# Patient Record
Sex: Male | Born: 1955 | Marital: Single | Smoking: Current some day smoker
Health system: Southern US, Community
[De-identification: ages and names within clinical notes are randomized; demographics above are authoritative.]

## PROBLEM LIST (undated history)

## (undated) DIAGNOSIS — F4310 Post-traumatic stress disorder, unspecified: Secondary | ICD-10-CM

## (undated) HISTORY — PX: STOMACH SURGERY: SHX791

## (undated) HISTORY — PX: CORONARY ANGIOPLASTY WITH STENT PLACEMENT: SHX49

## (undated) HISTORY — PX: SMALL INTESTINE SURGERY: SHX150

---

## 2011-10-17 ENCOUNTER — Observation Stay: Payer: Self-pay | Admitting: Internal Medicine

## 2011-10-17 DIAGNOSIS — R079 Chest pain, unspecified: Secondary | ICD-10-CM

## 2011-10-17 LAB — BASIC METABOLIC PANEL
BUN: 13 mg/dL (ref 7–18)
Calcium, Total: 9 mg/dL (ref 8.5–10.1)
Chloride: 109 mmol/L — ABNORMAL HIGH (ref 98–107)
Co2: 25 mmol/L (ref 21–32)
Creatinine: 0.95 mg/dL (ref 0.60–1.30)
Glucose: 96 mg/dL (ref 65–99)
Osmolality: 281 (ref 275–301)
Potassium: 3.9 mmol/L (ref 3.5–5.1)

## 2011-10-17 LAB — CBC
HCT: 44.1 % (ref 40.0–52.0)
HGB: 14.4 g/dL (ref 13.0–18.0)
MCH: 29 pg (ref 26.0–34.0)
MCHC: 32.6 g/dL (ref 32.0–36.0)
RBC: 4.94 10*6/uL (ref 4.40–5.90)
RDW: 15.7 % — ABNORMAL HIGH (ref 11.5–14.5)
WBC: 5.9 10*3/uL (ref 3.8–10.6)

## 2011-10-17 LAB — CK TOTAL AND CKMB (NOT AT ARMC): CK, Total: 105 U/L (ref 35–232)

## 2011-10-17 LAB — TROPONIN I: Troponin-I: <0.02 ng/mL
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
## 2014-09-26 NOTE — Discharge Summary (Signed)
PATIENT NAME:  Jim Burnett, Jim Burnett MR#:  [id number] DATE OF BIRTH:  Mar 20, 1956  DATE OF ADMISSION:  10/17/2011 DATE OF DISCHARGE:  10/17/2011  ADDENDUM:  His nuclear medicine scan was normal, no evidence of ischemia ____________________________ Chirstine Defrain P. Juliene PinaMody, MD spm:slb D: 10/18/2011 12:08:56 ET T: 10/18/2011 12:29:46 ET JOB#: [id number]  cc: Denny Mccree P. Juliene PinaMody, MD, <Dictator> Deaaron Fulghum P Durwin Davisson MD ELECTRONICALLY SIGNED 10/18/2011 21:08

## 2014-09-26 NOTE — Discharge Summary (Signed)
PATIENT NAME:  Jim Burnett, Jim Burnett MR#:  [id number] DATE OF BIRTH:  Jul 31, 1955  DATE OF ADMISSION:  10/17/2011 DATE OF DISCHARGE:  10/17/2011  ADMISSION DIAGNOSIS: Chest pain.   DISCHARGE DIAGNOSES:  1. Chest pain.  2. Elevated blood pressure without a diagnosis of hypertension.  3. Anxiety and depression.   CONSULTANTS: None.   LABS/STUDIES: 2-D echocardiogram showed an ejection fraction of 55%. Transmitral spectral Doppler flow pattern suggestive of impaired LV relaxation. RVSP is normal.   Troponins were negative.   STRESS TEST MYOVOEW: negative for acute coranary ischemia   HOSPITAL COURSE: This is a 59 year old male who presented with vague symptoms of chest pain. For further details, please refer to the history and physical.  1. Chest pain. The patient's symptoms subsided on admission.  He had no acute events on telemetry. Troponins were negative. He underwent a Myoview stress test which did not show evidence of cardiac ischemia. 2. Elevated blood pressure without a diagnosis of hypertension. The patient would like to follow-up with his primary care physician in the community before starting on medication. A list was provided for him.  3. Anxiety and depression. The patient will continue with his outpatient medication of bupropion.   DISCHARGE MEDICATIONS:  1. Bupropion 100 mg twice a day. 2. Aspirin 81 mg daily.  3. Flomax 0.4 mg daily.   DISCHARGE DIET: Low sodium.   DISCHARGE ACTIVITY: As tolerated.   DISCHARGE FOLLOWUP: The patient was provided a primary care list and he will choose one physician to followup with.    TIME SPENT: 35 minutes. ____________________________ Janyth ContesSital P. Juliene PinaMody, MD spm:slb D: 10/17/2011 14:17:21 ET T: 10/17/2011 17:51:33 ET JOB#: [id number]  cc: Anastasia Tompson P. Juliene PinaMody, MD, <Dictator> Renee Beale P Tavone Caesar MD ELECTRONICALLY SIGNED 10/18/2011 21:08

## 2014-12-05 ENCOUNTER — Emergency Department
Admission: EM | Admit: 2014-12-05 | Discharge: 2014-12-05 | Disposition: A | Discharge status: Home / Self Care | Payer: Self-pay | Attending: Emergency Medicine | Admitting: Emergency Medicine

## 2014-12-05 ENCOUNTER — Encounter: Payer: Self-pay | Admitting: Emergency Medicine

## 2014-12-05 ENCOUNTER — Emergency Department: Payer: Self-pay

## 2014-12-05 DIAGNOSIS — Z720 Tobacco use: Secondary | ICD-10-CM | POA: Insufficient documentation

## 2014-12-05 DIAGNOSIS — W25XXXA Contact with sharp glass, initial encounter: Secondary | ICD-10-CM | POA: Insufficient documentation

## 2014-12-05 DIAGNOSIS — S91331A Puncture wound without foreign body, right foot, initial encounter: Secondary | ICD-10-CM | POA: Insufficient documentation

## 2014-12-05 DIAGNOSIS — Y998 Other external cause status: Secondary | ICD-10-CM | POA: Insufficient documentation

## 2014-12-05 DIAGNOSIS — Y9389 Activity, other specified: Secondary | ICD-10-CM | POA: Insufficient documentation

## 2014-12-05 DIAGNOSIS — Z23 Encounter for immunization: Secondary | ICD-10-CM | POA: Insufficient documentation

## 2014-12-05 DIAGNOSIS — IMO0002 Reserved for concepts with insufficient information to code with codable children: Secondary | ICD-10-CM

## 2014-12-05 DIAGNOSIS — Y9259 Other trade areas as the place of occurrence of the external cause: Secondary | ICD-10-CM | POA: Insufficient documentation

## 2014-12-05 DIAGNOSIS — S91311A Laceration without foreign body, right foot, initial encounter: Secondary | ICD-10-CM | POA: Insufficient documentation

## 2014-12-05 HISTORY — DX: Post-traumatic stress disorder, unspecified: F43.10

## 2014-12-05 MED ORDER — IBUPROFEN 800 MG PO TABS
800.0000 mg | ORAL_TABLET | Freq: Once | ORAL | Status: AC
  Administered 2014-12-05: 800 mg via ORAL

## 2014-12-05 MED ORDER — BACITRACIN ZINC 500 UNIT/GM EX OINT
TOPICAL_OINTMENT | CUTANEOUS | Status: AC
  Administered 2014-12-05: 1 via TOPICAL
  Filled 2014-12-05: qty 0.9

## 2014-12-05 MED ORDER — BACITRACIN 500 UNIT/GM EX OINT
1.0000 "application " | TOPICAL_OINTMENT | Freq: Two times a day (BID) | CUTANEOUS | Status: DC
  Administered 2014-12-05: 1 via TOPICAL

## 2014-12-05 MED ORDER — TETANUS-DIPHTH-ACELL PERTUSSIS 5-2.5-18.5 LF-MCG/0.5 IM SUSP
INTRAMUSCULAR | Status: AC
  Administered 2014-12-05: 0.5 mL via INTRAMUSCULAR
  Filled 2014-12-05: qty 0.5

## 2014-12-05 MED ORDER — IBUPROFEN 800 MG PO TABS: 800.0000 mg | ORAL_TABLET | Freq: Three times a day (TID) | ORAL | Status: AC | PRN

## 2014-12-05 MED ORDER — TETANUS-DIPHTH-ACELL PERTUSSIS 5-2.5-18.5 LF-MCG/0.5 IM SUSP
0.5000 mL | Freq: Once | INTRAMUSCULAR | Status: AC
  Administered 2014-12-05: 0.5 mL via INTRAMUSCULAR

## 2014-12-05 MED ORDER — IBUPROFEN 800 MG PO TABS
ORAL_TABLET | ORAL | Status: AC
  Administered 2014-12-05: 800 mg via ORAL
  Filled 2014-12-05: qty 1

## 2014-12-05 MED ORDER — CEPHALEXIN 500 MG PO CAPS: 500.0000 mg | ORAL_CAPSULE | Freq: Two times a day (BID) | ORAL | Status: AC

## 2014-12-05 NOTE — ED Notes (Addendum)
Pt reports stepping on a glass piece of crack pipe at a hotel. Pt reports bleeding from right heel that has resolved at this time. Pt reports throbbing pain to right heel.

## 2014-12-05 NOTE — ED Provider Notes (Signed)
[facility] Emergency Department Provider Note ____________________________________________  Time seen: Approximately 7:00 PM  I have reviewed the triage vital signs and the nursing notes.   HISTORY  Chief Complaint Laceration   HPI Jim Burnett is a 59 y.o. male who presents to the emergency department for evaluation of right heel laceration. He states that he is staying in a [employer]. He tells me he went to get into the shower and looked down and saw that there was blood on the floor of the tub and on the towel on the floor. He then saw that he was bleeding from his right heel. After going back into the room, he noticed a "broken crack pipe" and blood in the floor.   Past Medical History  Diagnosis Date  . PTSD (post-traumatic stress disorder)     There are no active problems to display for this patient.   Past Surgical History  Procedure Laterality Date  . Coronary angioplasty with stent placement    . Stomach surgery    . Small intestine surgery      Current Outpatient Rx  Name  Route  Sig  Dispense  Refill  . cephALEXin (KEFLEX) 500 MG capsule   Oral   Take 1 capsule (500 mg total) by mouth 2 (two) times daily.   20 capsule   0   . ibuprofen (ADVIL,MOTRIN) 800 MG tablet   Oral   Take 1 tablet (800 mg total) by mouth every 8 (eight) hours as needed.   30 tablet   0     Allergies Quinine derivatives  History reviewed. No pertinent family history.  Social History History  Substance Use Topics  . Smoking status: Current Some Day Smoker    Types: Cigarettes  . Smokeless tobacco: Never Used  . Alcohol Use: Yes     Comment: occasionaly    Review of Systems Constitutional: No recent illness. Eyes: No visual changes. ENT: No sore throat. Cardiovascular: Denies chest pain or palpitations. Respiratory: Denies shortness of breath. Gastrointestinal: No abdominal pain.  Genitourinary: Negative for dysuria. Musculoskeletal: Pain in  right heel. Skin: Negative for rash. Neurological: Negative for headaches, focal weakness or numbness. 10-point ROS otherwise negative.  ____________________________________________   PHYSICAL EXAM:  VITAL SIGNS: ED Triage Vitals  Enc Vitals Group     BP 12/05/14 1829 127/70 mmHg     Pulse Rate 12/05/14 1829 76     Resp 12/05/14 1829 18     Temp 12/05/14 1829 97.7 F (36.5 C)     Temp Source 12/05/14 1829 Oral     SpO2 12/05/14 1829 98 %     Weight 12/05/14 1829 164 lb (74.39 kg)     Height 12/05/14 1829  (1.753 m)     Head Cir --      Peak Flow --      Pain Score 12/05/14 1838 7     Pain Loc --      Pain Edu? --      Excl. in GC? --     Constitutional: Alert and oriented. Well appearing and in no acute distress. Eyes: Conjunctivae are normal. EOMI. Head: Atraumatic. Nose: No congestion/rhinnorhea. Neck: No stridor.  Respiratory: Normal respiratory effort.   Musculoskeletal: Pain in right heel and foot with ambulation Neurologic:  Normal speech and language. No gross focal neurologic deficits are appreciated. Speech is normal. No gait instability. Skin:  Skin is warm, dry and intact. 1cm laceration to posterior right heel just above plantar  surface. No bleeding at time of assessment. No obvious foreign body. Psychiatric: Mood and affect are normal. Speech and behavior are normal.  ____________________________________________   LABS (all labs ordered are listed, but only abnormal results are displayed)  Labs Reviewed - No data to display ____________________________________________  RADIOLOGY  No foreign body identified.  ____________________________________________   PROCEDURES  Procedure(s) performed: Area was cleaned and Bacitracin dressing applied by RN.   ____________________________________________   INITIAL IMPRESSION / ASSESSMENT AND PLAN / ED COURSE  Pertinent labs & imaging results that were available during my care of the patient were  reviewed by me and considered in my medical decision making (see chart for details).  Patient was advised to take the antibiotics until finished. He was advised to follow up with primary care or return to the emergency department for concern for infection. ____________________________________________   FINAL CLINICAL IMPRESSION(S) / ED DIAGNOSES  Final diagnoses:  Laceration  Laceration of heel without complication, right, initial encounter  Puncture wound of heel, right, initial encounter       Chinita PesterCari B Donaciano Range, FNP 12/05/14 2050  Darien Ramusavid W Kaminski, MD 12/05/14 2337

## 2016-03-22 IMAGING — CR DG OS CALCIS 2+V*R*
1 series · 2 of 2 positions shown · non-contrast
Comparison: None.

CLINICAL DATA: Laceration to the right heel after stepping on
broken glass today.

EXAM:
RIGHT OS CALCIS - 2+ VIEW

[Series 1: dg os calcis right · 0.14mm/px · 2 of 2 slices shown]
[im 1/2]
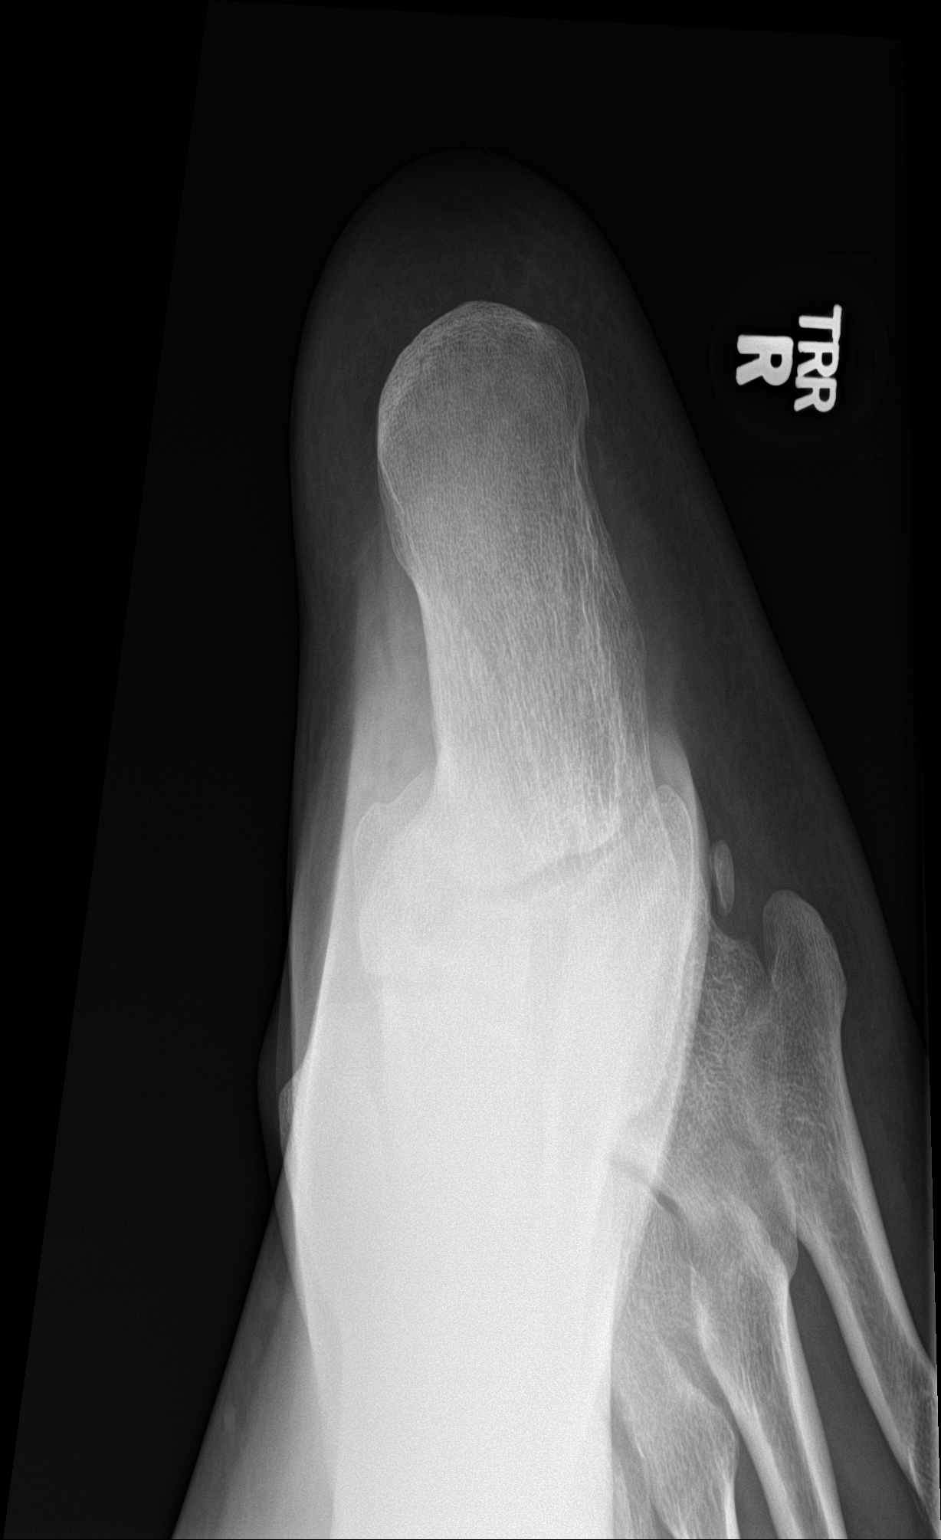
[im 2/2]
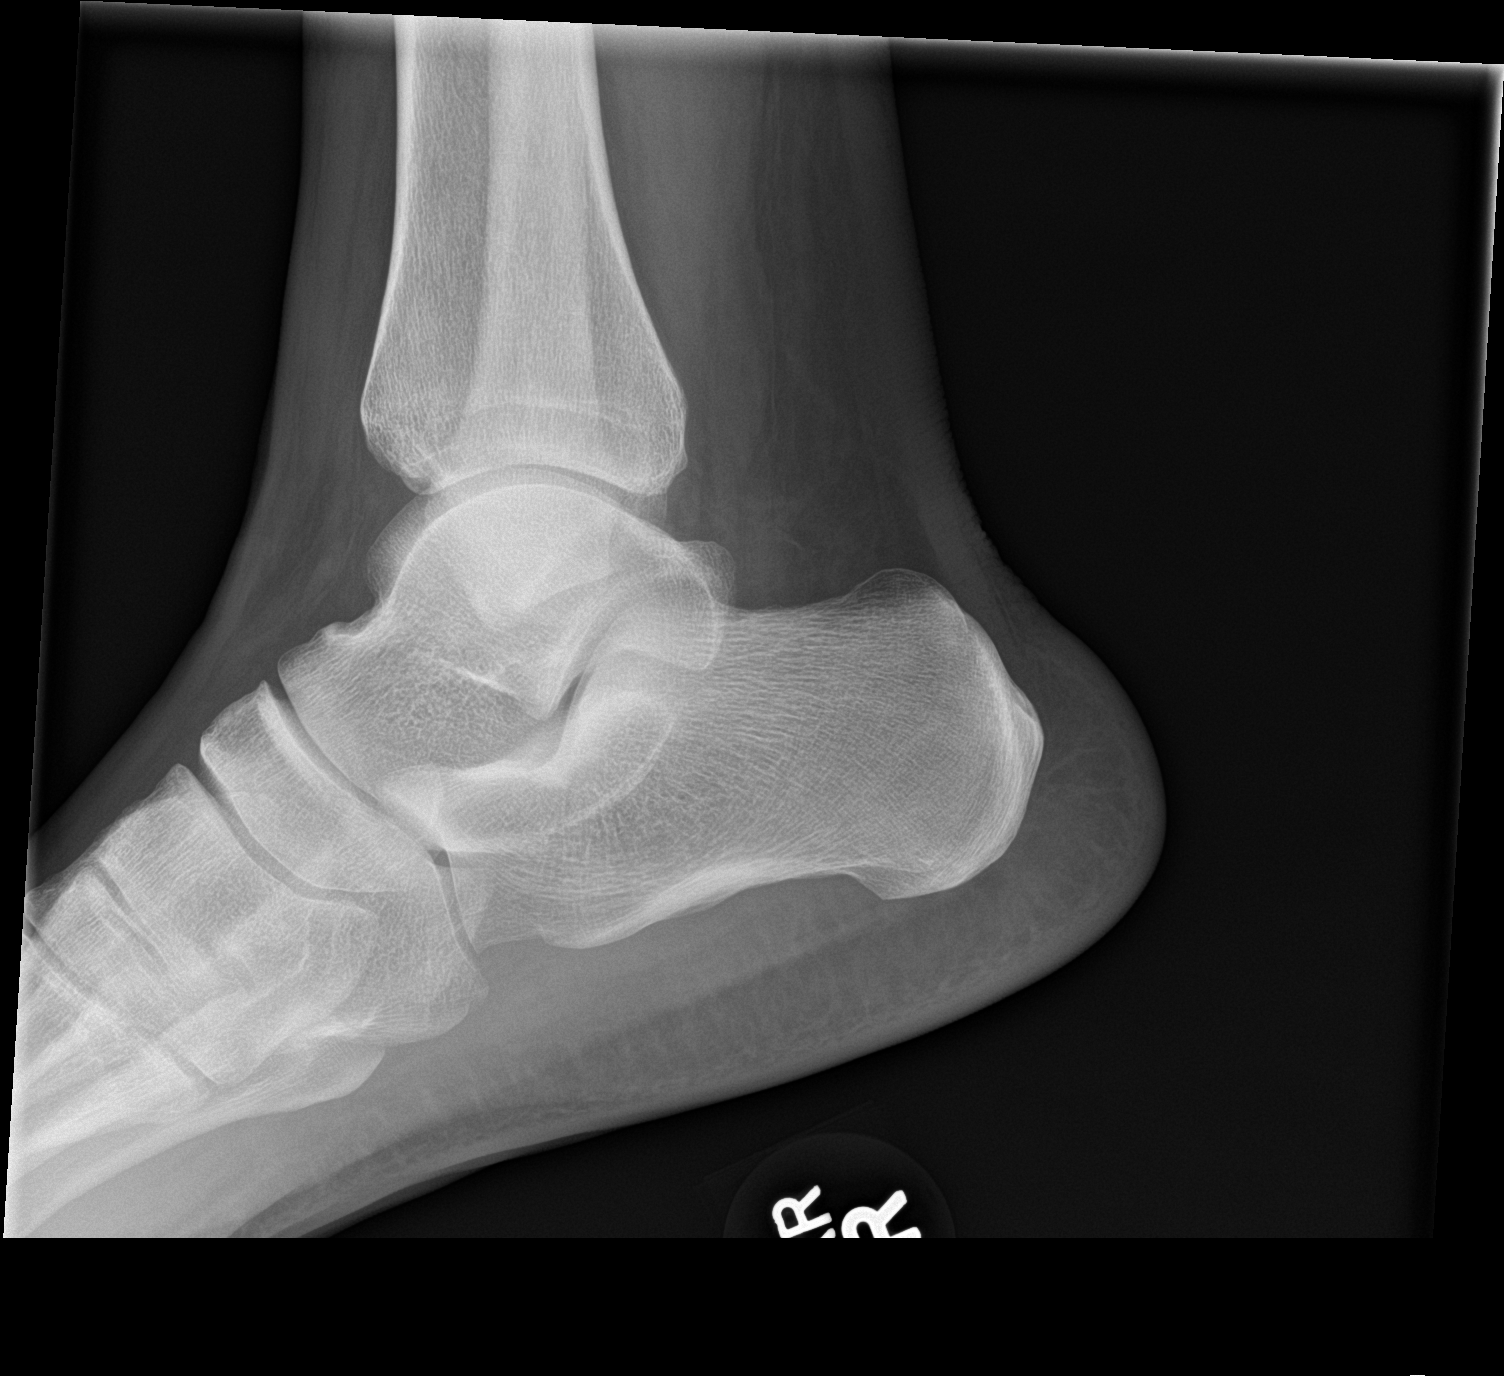

[2 of 2 positions shown; findings below may reference images not displayed]

FINDINGS: There is no evidence of fracture or other focal bone lesions. Soft
tissues are unremarkable. No radiopaque foreign bodies identified.
IMPRESSION: Negative.

## 2017-07-05 DEATH — deceased
